# Patient Record
Sex: Male | Born: 1970 | Race: White | Hispanic: No | Marital: Single | Smoking: Current some day smoker
Health system: Southern US, Community
[De-identification: ages and names within clinical notes are randomized; demographics above are authoritative.]

## PROBLEM LIST (undated history)

## (undated) DIAGNOSIS — C9000 Multiple myeloma not having achieved remission: Secondary | ICD-10-CM

---

## 2012-04-22 ENCOUNTER — Emergency Department: Payer: Self-pay | Admitting: Emergency Medicine

## 2015-04-04 ENCOUNTER — Encounter: Payer: Self-pay | Admitting: Emergency Medicine

## 2015-04-04 ENCOUNTER — Emergency Department
Admission: EM | Admit: 2015-04-04 | Discharge: 2015-04-04 | Disposition: A | Discharge status: Home / Self Care | Payer: Medicaid Other | Attending: Emergency Medicine | Admitting: Emergency Medicine

## 2015-04-04 ENCOUNTER — Emergency Department: Payer: Medicaid Other

## 2015-04-04 DIAGNOSIS — F1123 Opioid dependence with withdrawal: Secondary | ICD-10-CM | POA: Diagnosis not present

## 2015-04-04 DIAGNOSIS — F1310 Sedative, hypnotic or anxiolytic abuse, uncomplicated: Secondary | ICD-10-CM | POA: Diagnosis not present

## 2015-04-04 DIAGNOSIS — R451 Restlessness and agitation: Secondary | ICD-10-CM | POA: Diagnosis not present

## 2015-04-04 DIAGNOSIS — R253 Fasciculation: Secondary | ICD-10-CM | POA: Diagnosis not present

## 2015-04-04 DIAGNOSIS — R569 Unspecified convulsions: Secondary | ICD-10-CM | POA: Diagnosis present

## 2015-04-04 DIAGNOSIS — F17200 Nicotine dependence, unspecified, uncomplicated: Secondary | ICD-10-CM | POA: Diagnosis not present

## 2015-04-04 DIAGNOSIS — Z880 Allergy status to penicillin: Secondary | ICD-10-CM | POA: Diagnosis not present

## 2015-04-04 DIAGNOSIS — F1193 Opioid use, unspecified with withdrawal: Secondary | ICD-10-CM

## 2015-04-04 HISTORY — DX: Multiple myeloma not having achieved remission: C90.00

## 2015-04-04 LAB — CBC
HCT: 42.5 % (ref 40.0–52.0)
Hemoglobin: 14.6 g/dL (ref 13.0–18.0)
MCH: 30.7 pg (ref 26.0–34.0)
MCHC: 34.5 g/dL (ref 32.0–36.0)
MCV: 89 fL (ref 80.0–100.0)
PLATELETS: 294 10*3/uL (ref 150–440)
RBC: 4.77 MIL/uL (ref 4.40–5.90)
RDW: 15 % — AB (ref 11.5–14.5)
WBC: 8.7 10*3/uL (ref 3.8–10.6)

## 2015-04-04 LAB — URINALYSIS COMPLETE WITH MICROSCOPIC (ARMC ONLY)
BACTERIA UA: NONE SEEN
Bilirubin Urine: NEGATIVE
Glucose, UA: NEGATIVE mg/dL
LEUKOCYTES UA: NEGATIVE
Nitrite: NEGATIVE
PH: 5 (ref 5.0–8.0)
PROTEIN: 30 mg/dL — AB
SPECIFIC GRAVITY, URINE: 1.02 (ref 1.005–1.030)

## 2015-04-04 LAB — URINE DRUG SCREEN, QUALITATIVE (ARMC ONLY)
Amphetamines, Ur Screen: NOT DETECTED
BARBITURATES, UR SCREEN: NOT DETECTED
Benzodiazepine, Ur Scrn: POSITIVE — AB
CANNABINOID 50 NG, UR ~~LOC~~: NOT DETECTED
COCAINE METABOLITE, UR ~~LOC~~: NOT DETECTED
MDMA (Ecstasy)Ur Screen: NOT DETECTED
Methadone Scn, Ur: NOT DETECTED
Opiate, Ur Screen: NOT DETECTED
Phencyclidine (PCP) Ur S: NOT DETECTED
TRICYCLIC, UR SCREEN: NOT DETECTED

## 2015-04-04 LAB — ETHANOL: Alcohol, Ethyl (B): <5 mg/dL (ref <5)

## 2015-04-04 LAB — ACETAMINOPHEN LEVEL

## 2015-04-04 LAB — BASIC METABOLIC PANEL
Anion gap: 10 (ref 5–15)
BUN: 11 mg/dL (ref 6–20)
CHLORIDE: 106 mmol/L (ref 101–111)
CO2: 23 mmol/L (ref 22–32)
Calcium: 9.9 mg/dL (ref 8.9–10.3)
Creatinine, Ser: 0.78 mg/dL (ref 0.61–1.24)
GFR calc Af Amer: >60 mL/min (ref >60)
GFR calc non Af Amer: >60 mL/min (ref >60)
GLUCOSE: 113 mg/dL — AB (ref 65–99)
POTASSIUM: 4.2 mmol/L (ref 3.5–5.1)
Sodium: 139 mmol/L (ref 135–145)

## 2015-04-04 LAB — HEPATIC FUNCTION PANEL
ALT: 248 U/L — AB (ref 17–63)
AST: 160 U/L — ABNORMAL HIGH (ref 15–41)
Albumin: 4.5 g/dL (ref 3.5–5.0)
Alkaline Phosphatase: 91 U/L (ref 38–126)
BILIRUBIN DIRECT: 0.3 mg/dL (ref 0.1–0.5)
BILIRUBIN INDIRECT: 1 mg/dL — AB (ref 0.3–0.9)
Total Bilirubin: 1.3 mg/dL — ABNORMAL HIGH (ref 0.3–1.2)
Total Protein: 7.7 g/dL (ref 6.5–8.1)

## 2015-04-04 LAB — LIPASE, BLOOD: Lipase: 20 U/L (ref 11–51)

## 2015-04-04 LAB — SALICYLATE LEVEL

## 2015-04-04 MED ORDER — DIPHENHYDRAMINE HCL 50 MG/ML IJ SOLN
50.0000 mg | Freq: Once | INTRAMUSCULAR | Status: DC
Start: 2015-04-04 — End: 2015-04-04

## 2015-04-04 MED ORDER — LORAZEPAM 2 MG/ML IJ SOLN
INTRAMUSCULAR | Status: AC
  Filled 2015-04-04: qty 1

## 2015-04-04 MED ORDER — LORAZEPAM 2 MG/ML IJ SOLN: 1.0000 mg | Freq: Once | INTRAMUSCULAR | Status: DC

## 2015-04-04 MED ORDER — LORAZEPAM 2 MG/ML IJ SOLN
1.0000 mg | Freq: Once | INTRAMUSCULAR | Status: AC
  Administered 2015-04-04: 1 mg via INTRAMUSCULAR

## 2015-04-04 MED ORDER — LORAZEPAM 2 MG/ML IJ SOLN
1.0000 mg | Freq: Once | INTRAMUSCULAR | Status: AC
Start: 2015-04-04 — End: 2015-04-04
  Administered 2015-04-04: 1 mg via INTRAMUSCULAR
  Filled 2015-04-04: qty 1

## 2015-04-04 MED ORDER — SODIUM CHLORIDE 0.9 % IV BOLUS (SEPSIS)
1000.0000 mL | Freq: Once | INTRAVENOUS | Status: AC
  Administered 2015-04-04: 1000 mL via INTRAVENOUS

## 2015-04-04 MED ORDER — PROMETHAZINE HCL 25 MG PO TABS: 25.0000 mg | ORAL_TABLET | Freq: Four times a day (QID) | ORAL | Status: DC | PRN

## 2015-04-04 MED ORDER — DIPHENHYDRAMINE HCL 50 MG/ML IJ SOLN
INTRAMUSCULAR | Status: AC
  Administered 2015-04-04: 50 mg via INTRAMUSCULAR
  Filled 2015-04-04: qty 1

## 2015-04-04 NOTE — ED Notes (Signed)
Pt went to ct scan by strecher.  Pt has constant movements especially of lower extremities.  Person at bedside says it has not changed since last night and was not improved by medication earlier.  md informed and benadryl given prior to patient going to ct.  Lab says cbc tube they drew is clotted. Will infomr md.

## 2015-04-04 NOTE — ED Notes (Signed)
Pt states has been shooting fentanyl and is attempting with withdrawal self from fentanyl. Pt's girlfriend states pt has a "lock up" seizure approx 30 min pta. Pt does not remember incident, no oral trauma, injury, or loss of bladder/bowel control noted. Skin pwd. perrl 64mm brisk, pt moves all extremities without difficulty. Pt complains of bilateral leg pain.

## 2015-04-04 NOTE — ED Notes (Signed)
Seizure pads placed, suction at bedside. Call bell on right side.

## 2015-04-04 NOTE — ED Provider Notes (Addendum)
-----------------------------------------   10:01 AM on 04/04/2015 -----------------------------------------  Patient's labs are largely within normal limits. Patient states last and only use approximately 36 hours ago. I discussed with the patient detox facilities, and TTS evaluation for detox. The patient states he has Suboxone at home, he does not want to be admitted or go to a facility for detox but cannot use Suboxone. Patient states he rather be discharged home, states he has done it before and he can do it again. We will prescribe Phenergan for the patient's nausea. Patient continues to feel very restless, discussed the patient use some Benadryl at home 50 mg every 8 hours for restlessness.   Unable to obtain a CBC and multiple attempts including finger stick CBC which clotted. Unable to obtain a CBC after multiple attempts including finger stick CBC which clotted. At this time I feel the patient's symptoms are very suggestive of opiate withdrawal, and I do not believe the CBC would change his management. We will discharge him at this time.  Harvest Dark, MD 04/04/15 [place], MD 04/04/15 1013

## 2015-04-04 NOTE — ED Notes (Signed)
Multiple IV insertion attempts made unsuccessfully. Patient continue to pace and shake uncontrollably throughout IV insertion attempts. MD Dahlia Client made aware.

## 2015-04-04 NOTE — Discharge Instructions (Signed)
Opioid Withdrawal  Opioids are a group of narcotic drugs. They include the street drug heroin. They also include pain medicines, such as morphine, hydrocodone, oxycodone, and fentanyl. Opioid withdrawal is a group of characteristic physical and mental signs and symptoms. It typically occurs if you have been using opioids daily for several weeks or longer and stop using or rapidly decrease use. Opioid withdrawal can also occur if you have used opioids daily for a long time and are given a medicine to block the effect.   SIGNS AND SYMPTOMS  Opioid withdrawal includes three or more of the following symptoms:   · Depressed, anxious, or irritable mood.  · Nausea or vomiting.  · Muscle aches or spasms.    · Watery eyes.     · Runny nose.  · Dilated pupils, sweating, or hairs standing on end.  · Diarrhea or intestinal cramping.  · Yawning.    · Fever.  · Increased blood pressure.  · Fast pulse.  · Restlessness or trouble sleeping.  These signs and symptoms occur within several hours of stopping or reducing short-acting opioids, such as heroin. They can occur within 3 days of stopping or reducing long-acting opioids, such as methadone. Withdrawal begins within minutes of receiving a drug that blocks the effects of opioids, such as naltrexone or naloxone.  DIAGNOSIS   Opioid use disorder is diagnosed by your health care provider. You will be asked about your symptoms, drug and alcohol use, medical history, and use of medicines. A physical exam may be done. Lab tests may be ordered. Your health care provider may have you see a mental health professional.   TREATMENT   The treatment for opioid withdrawal is usually provided by medical doctors with special training in substance use disorders (addiction specialists). The following medicines may be included in treatment:  · Opioids given in place of the abused opioid. They turn on opioid receptors in the brain and lessen or prevent withdrawal symptoms. They are gradually  decreased (opioid substitution and taper).  · Non-opioids that can lessen certain opioid withdrawal symptoms. They may be used alone or with opioid substitution and taper.  Successful long-term recovery usually requires medicine, counseling, and group support.  HOME CARE INSTRUCTIONS   · Take medicines only as directed by your health care provider.  · Check with your health care provider before starting new medicines.  · Keep all follow-up visits as directed by your health care provider.  SEEK MEDICAL CARE IF:  · You are not able to take your medicines as directed.  · Your symptoms get worse.  · You relapse.  SEEK IMMEDIATE MEDICAL CARE IF:  · You have serious thoughts about hurting yourself or others.  · You have a seizure.  · You lose consciousness.     This information is not intended to replace advice given to you by your health care provider. Make sure you discuss any questions you have with your health care provider.     Document Released: 01/11/2003 Document Revised: 01/29/2014 Document Reviewed: 01/21/2013  Elsevier Interactive Patient Education ©2016 [employer]

## 2015-04-04 NOTE — ED Provider Notes (Signed)
[facility] Emergency Department Provider Note  ____________________________________________  Time seen: Approximately 530 AM  I have reviewed the triage vital signs and the nursing notes.   HISTORY  Chief Complaint Seizures    HPI Devon Bradley. is a 45 y.o. male comes into the hospital today with a seizure. The patient reports that he is withdrawing from fentanyl. He goes to a Suboxone clinic and reports that the only drug he uses is fentanyl. He reports that he drinks a couple of beers but it's nothing really. According to the patient's girlfriend it appeared though he was having a seizure. He has been trying to wean himself from his fentanyl and has been 30 hours since he last used. His girlfriend reports that they were in bed and he's been very twitchy and unable to stay still. She reports that when she looked over he appeared to be locked up and couldn't move. She reports that she is unable to know if he was awake as he was laying next to her when she turned the lights on he was very out of it. She reports that the seizure lasted approximately 2 minutes. The patient has been very restless and shaky. He is also been having some vomiting and diarrhea. He has not been eating much and has been very sweaty. The patient denies any abdominal pain at this time. The patient's girlfriend was concerned so she brought him in for evaluation.   Past Medical History  Diagnosis Date  . Metastatic multiple myeloma to bone (HCC)     There are no active problems to display for this patient.   History reviewed. No pertinent past surgical history.  No current outpatient prescriptions on file.  Allergies Amoxicillin  History reviewed. No pertinent family history.  Social History Social History  Substance Use Topics  . Smoking status: Current Some Day Smoker  . Smokeless tobacco: Never Used  . Alcohol Use: No    Review of Systems Constitutional: No  fever/chills Eyes: No visual changes. ENT: No sore throat. Cardiovascular: Denies chest pain. Respiratory: Denies shortness of breath. Gastrointestinal: No abdominal pain.  No nausea, no vomiting.  No diarrhea.  No constipation. Genitourinary: Negative for dysuria. Musculoskeletal: Negative for back pain. Skin: Negative for rash. Neurological: Restlessness and shakes, seizure  10-point ROS otherwise negative.  ____________________________________________   PHYSICAL EXAM:  VITAL SIGNS: ED Triage Vitals  Enc Vitals Group     BP 04/04/15 0505 125/82 mmHg     Pulse Rate 04/04/15 0505 68     Resp 04/04/15 0505 22     Temp 04/04/15 0505 98.7 F (37.1 C)     Temp Source 04/04/15 0505 Oral     SpO2 04/04/15 0505 96 %     Weight 04/04/15 0505 225 lb (102.059 kg)     Height 04/04/15 0505 6' 1"  (1.854 m)     Head Cir --      Peak Flow --      Pain Score --      Pain Loc --      Pain Edu? --      Excl. in [place]? --     Constitutional: Alert and oriented. Well appearing and in no acute distress. Eyes: Conjunctivae are normal. PERRL. EOMI. Head: Atraumatic. Nose: No congestion/rhinnorhea. Mouth/Throat: Mucous membranes are moist.  Oropharynx non-erythematous. Cardiovascular: Normal rate, regular rhythm. Grossly normal heart sounds.  Good peripheral circulation. Respiratory: Normal respiratory effort.  No retractions. Lungs CTAB. Gastrointestinal: Soft and nontender. No distention.  Positive bowel sounds Musculoskeletal: No lower extremity tenderness nor edema.   Neurologic:  Normal speech and language. Cranial nerves II through XII are grossly intact with no focal motor or neuro deficits Skin:  Skin is warm, dry and intact.  Psychiatric: Mood and affect are normal.   ____________________________________________   LABS (all labs ordered are listed, but only abnormal results are displayed)  Labs Reviewed  BASIC METABOLIC PANEL - Abnormal; Notable for the following:    Glucose,  Bld 113 (*)    All other components within normal limits  ACETAMINOPHEN LEVEL - Abnormal; Notable for the following:    Acetaminophen (Tylenol), Serum <10 (*)    All other components within normal limits  HEPATIC FUNCTION PANEL - Abnormal; Notable for the following:    AST 160 (*)    ALT 248 (*)    Total Bilirubin 1.3 (*)    Indirect Bilirubin 1.0 (*)    All other components within normal limits  ETHANOL  SALICYLATE LEVEL  URINALYSIS COMPLETEWITH MICROSCOPIC ([facility] ONLY)  URINE DRUG SCREEN, QUALITATIVE ([facility] ONLY)  CBC  LIPASE, BLOOD   ____________________________________________  EKG  ED ECG REPORT I, Loney Hering, the attending physician, personally viewed and interpreted this ECG.   Date: 04/04/2015  EKG Time: 716  Rate: 90  Rhythm: normal sinus rhythm  Axis: normal  Intervals:none  ST&T Change: none  ____________________________________________  RADIOLOGY  CT head: pending ____________________________________________   PROCEDURES  Procedure(s) performed: None  Critical Care performed: No  ____________________________________________   INITIAL IMPRESSION / ASSESSMENT AND PLAN / ED COURSE  Pertinent labs & imaging results that were available during my care of the patient were reviewed by me and considered in my medical decision making (see chart for details).  This is a 45 year old male who was weaning himself off from that no who comes in today with a seizure. The patient is very jittery and twitchy. They're having a hard time finding veins on him. The patient will be given 1 mg of Ativan IM and some normal saline. We will check some blood work and reassess the patient.  At this time we are awaiting the results of the patient's blood work. He has received 2 doses of ativan as he was very shaky on  Arrival to the ED. Staff had a difficult time obtaining an IV due to the patient's IV drug abuse history. We will give the patient some normal saline and  await the results of the remaining blood work. The patient's care will be signed out to Dr. Kerman Passey who will reassess the patient and follow up the results of his labs.   The patient received Benadryl as well IM. ____________________________________________   FINAL CLINICAL IMPRESSION(S) / ED DIAGNOSES  Final diagnoses:  Seizure ([place])  Opiate withdrawal ([place])  Twitching      Loney Hering, MD 04/04/15 [phone]

## 2016-02-15 ENCOUNTER — Emergency Department
Admission: EM | Admit: 2016-02-15 | Discharge: 2016-02-15 | Disposition: A | Discharge status: Home / Self Care | Payer: Self-pay | Attending: Emergency Medicine | Admitting: Emergency Medicine

## 2016-02-15 ENCOUNTER — Emergency Department: Payer: Self-pay

## 2016-02-15 ENCOUNTER — Encounter: Payer: Self-pay | Admitting: *Deleted

## 2016-02-15 DIAGNOSIS — F1190 Opioid use, unspecified, uncomplicated: Secondary | ICD-10-CM

## 2016-02-15 DIAGNOSIS — Y999 Unspecified external cause status: Secondary | ICD-10-CM | POA: Insufficient documentation

## 2016-02-15 DIAGNOSIS — W010XXA Fall on same level from slipping, tripping and stumbling without subsequent striking against object, initial encounter: Secondary | ICD-10-CM | POA: Insufficient documentation

## 2016-02-15 DIAGNOSIS — S40011A Contusion of right shoulder, initial encounter: Secondary | ICD-10-CM | POA: Insufficient documentation

## 2016-02-15 DIAGNOSIS — S40851A Superficial foreign body of right upper arm, initial encounter: Secondary | ICD-10-CM | POA: Insufficient documentation

## 2016-02-15 DIAGNOSIS — F17200 Nicotine dependence, unspecified, uncomplicated: Secondary | ICD-10-CM | POA: Insufficient documentation

## 2016-02-15 DIAGNOSIS — Y929 Unspecified place or not applicable: Secondary | ICD-10-CM | POA: Insufficient documentation

## 2016-02-15 DIAGNOSIS — Y9389 Activity, other specified: Secondary | ICD-10-CM | POA: Insufficient documentation

## 2016-02-15 DIAGNOSIS — F1110 Opioid abuse, uncomplicated: Secondary | ICD-10-CM | POA: Insufficient documentation

## 2016-02-15 DIAGNOSIS — S40251A Superficial foreign body of right shoulder, initial encounter: Secondary | ICD-10-CM

## 2016-02-15 MED ORDER — KETOROLAC TROMETHAMINE 30 MG/ML IJ SOLN
30.0000 mg | Freq: Once | INTRAMUSCULAR | Status: AC
  Administered 2016-02-15: 30 mg via INTRAMUSCULAR
  Filled 2016-02-15: qty 1

## 2016-02-15 MED ORDER — NAPROXEN 500 MG PO TABS: 500.0000 mg | ORAL_TABLET | Freq: Two times a day (BID) | ORAL | 0 refills | Status: AC

## 2016-02-15 NOTE — Discharge Instructions (Signed)
May take OTC Tylenol as needed in conjunction with naprosen.

## 2016-02-15 NOTE — ED Notes (Signed)
See triage note  [place] this am landed on right arm  Now having tingling into right arm

## 2016-02-15 NOTE — ED Triage Notes (Signed)
States he has "broken syringes in his right arm" from a hx of drug abuse, states he fell in the shower this AM and his shoulder pain began when he landed on the original area
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
## 2017-09-21 IMAGING — CT CT HEAD W/O CM
4 series · 19 of 30 positions shown, 20 images · non-contrast
Comparison: None

CLINICAL DATA: Seizure.

EXAM:
CT HEAD WITHOUT CONTRAST
TECHNIQUE: Contiguous axial images were obtained from the base of the skull
through the vertex without intravenous contrast.

[Series 2: head wo · axial · 0.47mm/px · z∈[-110,-50]mm · 2 of 38 slices shown, 3 images]
[im 13/38  brain]
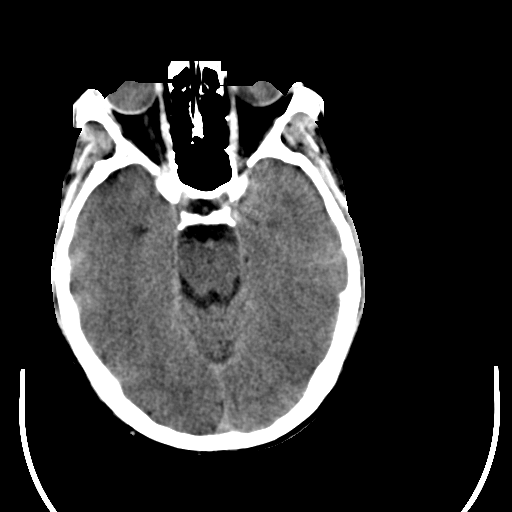
[im 13/38  bone]
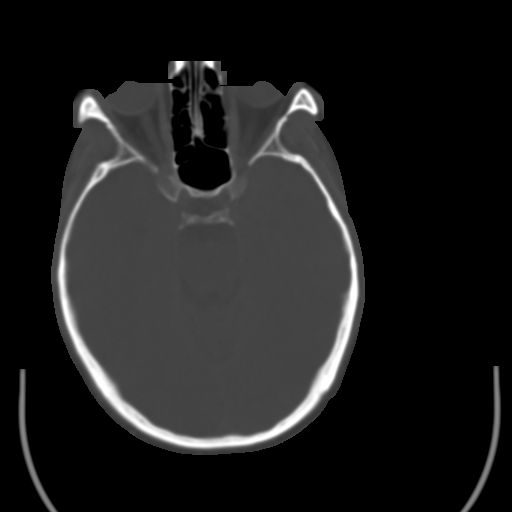
[im 25/38  brain]
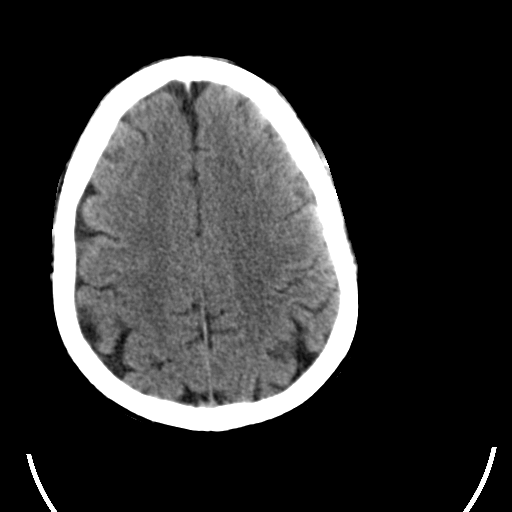

[Series 3: head bone · axial · 0.47mm/px · z∈[-152,-6]mm · 8 of 93 slices shown]
[im 10/93  bone]
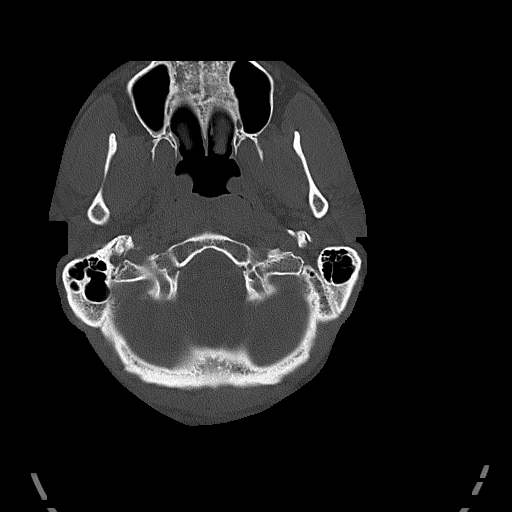
[im 19/93  bone]
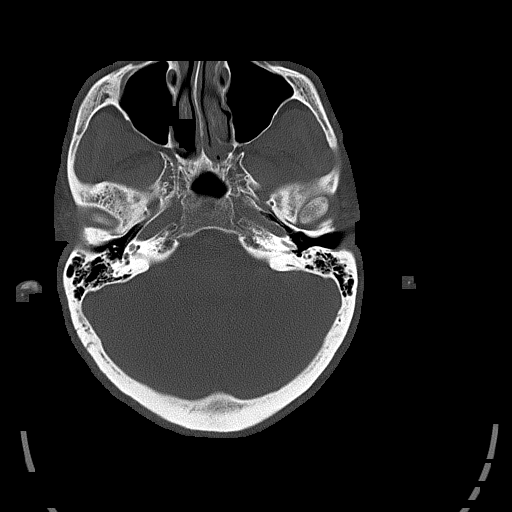
[im 28/93  bone]
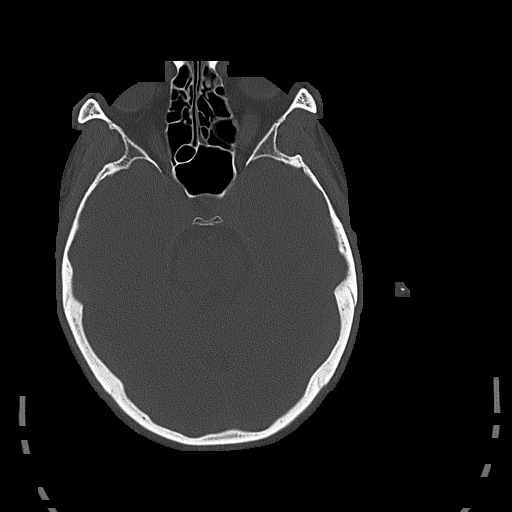
[im 37/93  bone]
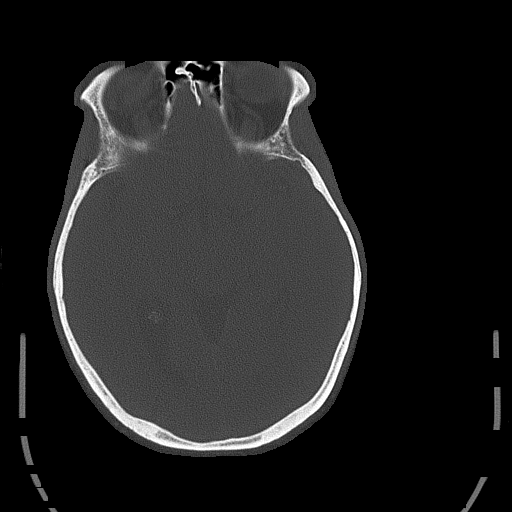
[im 56/93  bone]
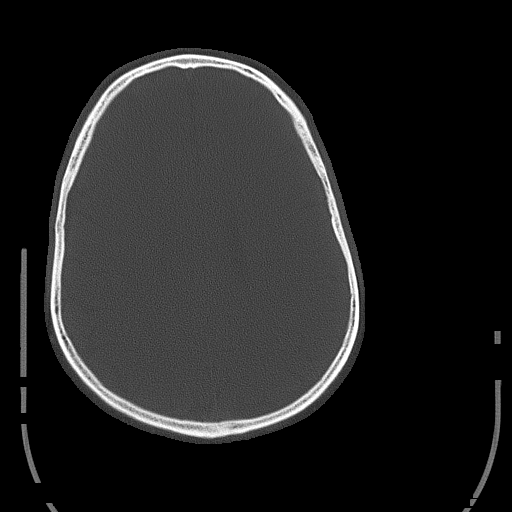
[im 65/93  bone]
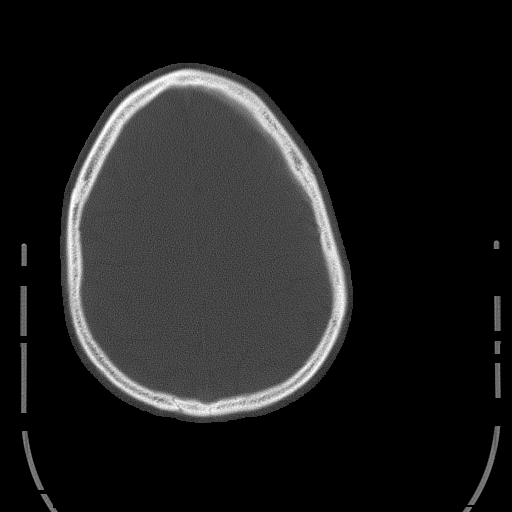
[im 74/93  bone]
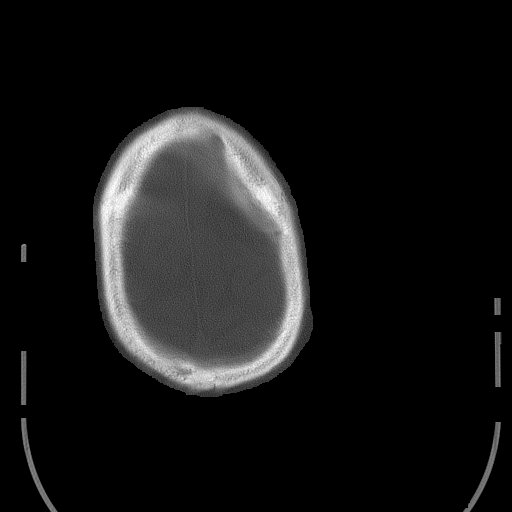
[im 83/93  bone]
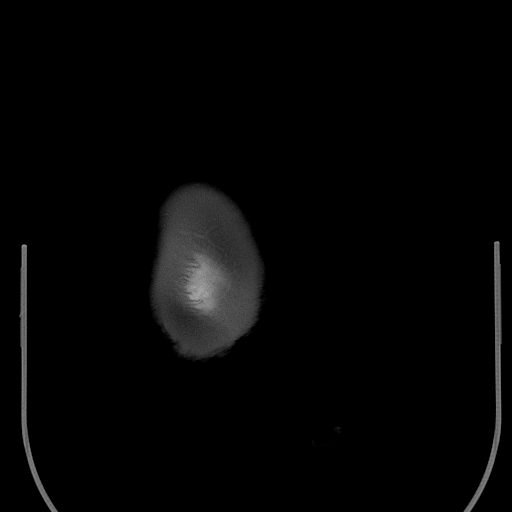

[Series 4: head wo recon · axial · 0.39mm/px · z∈[-60,-7]mm · 2 of 38 slices shown]
[im 13/38  brain]
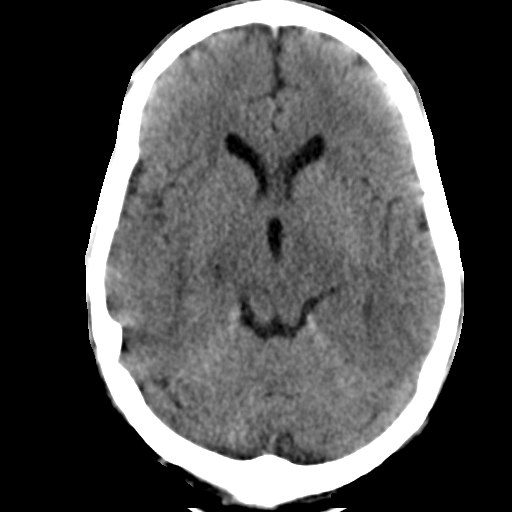
[im 25/38  brain]
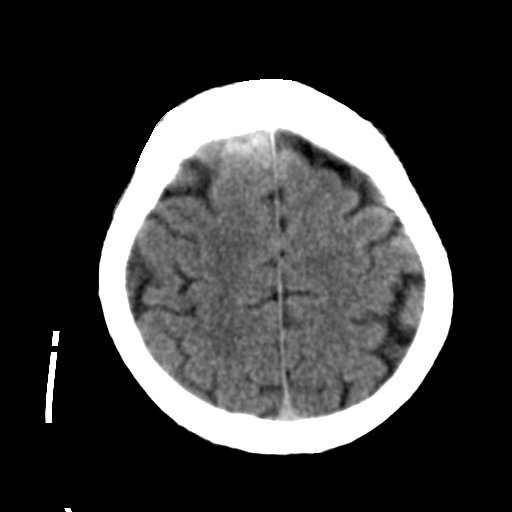

[Series 5: head bone recon · axial · 0.39mm/px · z∈[-110,+9]mm · 7 of 93 slices shown]
[im 10/93  bone]
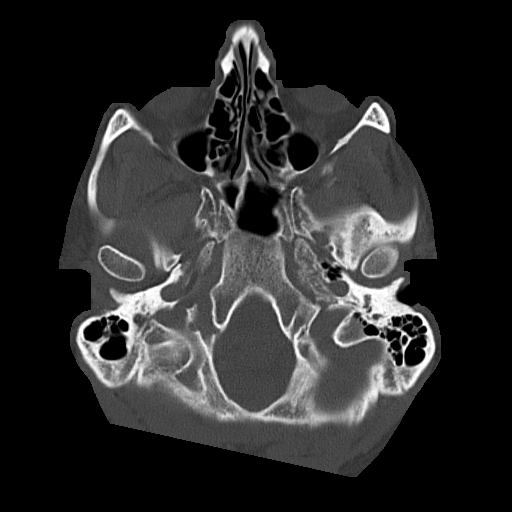
[im 19/93  bone]
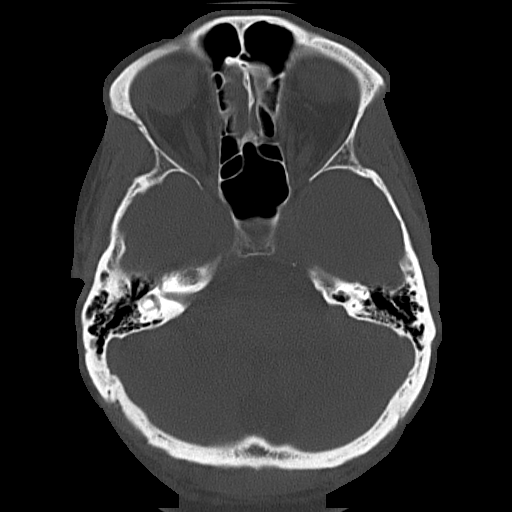
[im 28/93  bone]
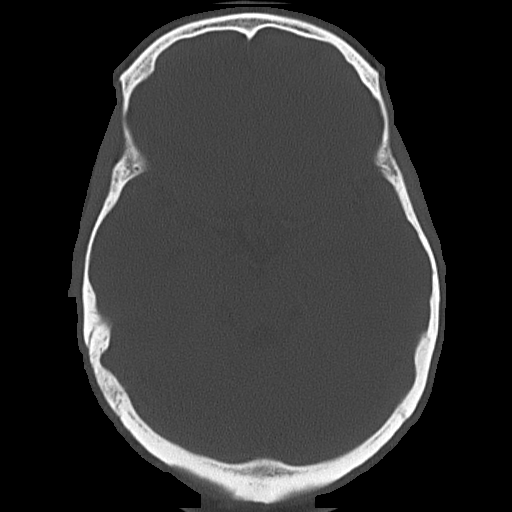
[im 37/93  bone]
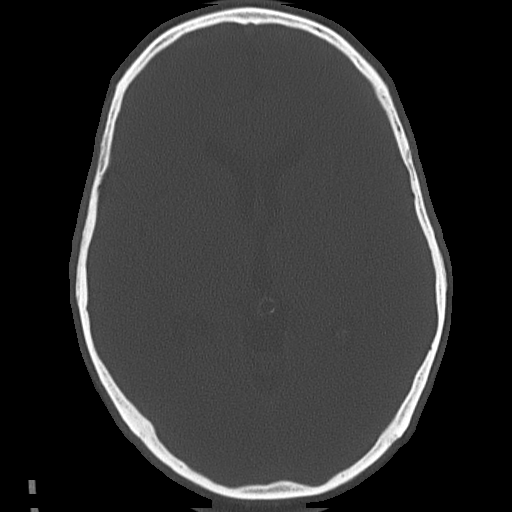
[im 56/93  bone]
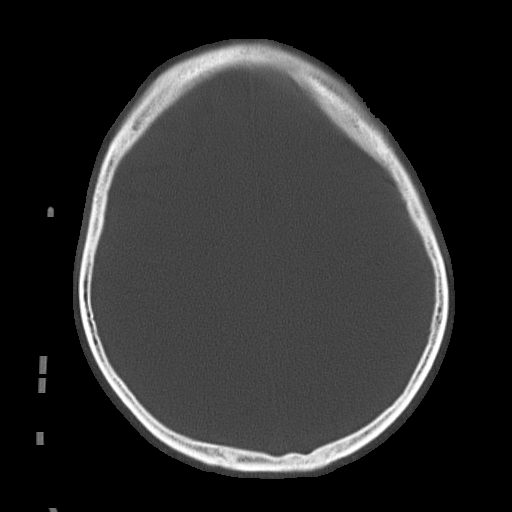
[im 65/93  bone]
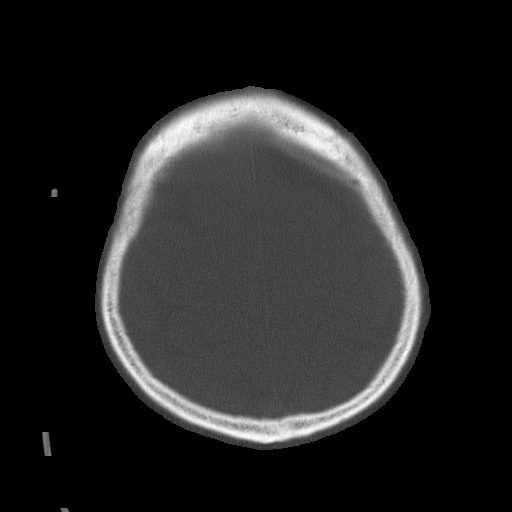
[im 74/93  bone]
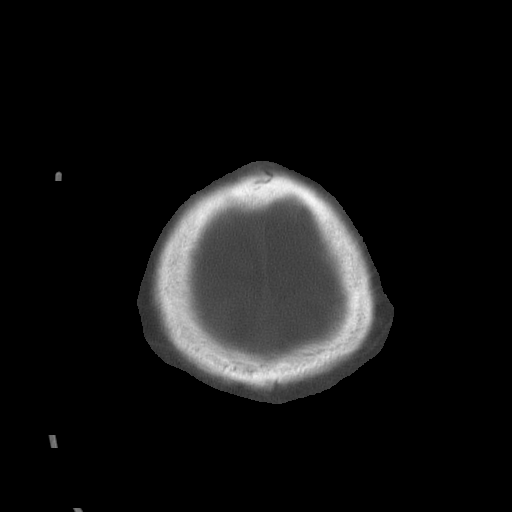

[19 of 30 positions shown; findings below may reference images not displayed]

FINDINGS: No mass lesion. No midline shift. No acute hemorrhage or hematoma.
No extra-axial fluid collections. No evidence of acute infarction.
Brain parenchyma is normal. Bones are normal.
IMPRESSION: Normal exam.

## 2018-08-04 IMAGING — CR DG SHOULDER 2+V*R*
1 series · 3 of 3 positions shown · non-contrast
Comparison: None.

CLINICAL DATA: Fell and injured right shoulder this morning. Right
shoulder pain. History of multiple myeloma.

EXAM:
RIGHT SHOULDER - 2+ VIEW

[Series 1: dg shoulder right · 0.14mm/px · 3 of 3 slices shown]
[im 1/3]
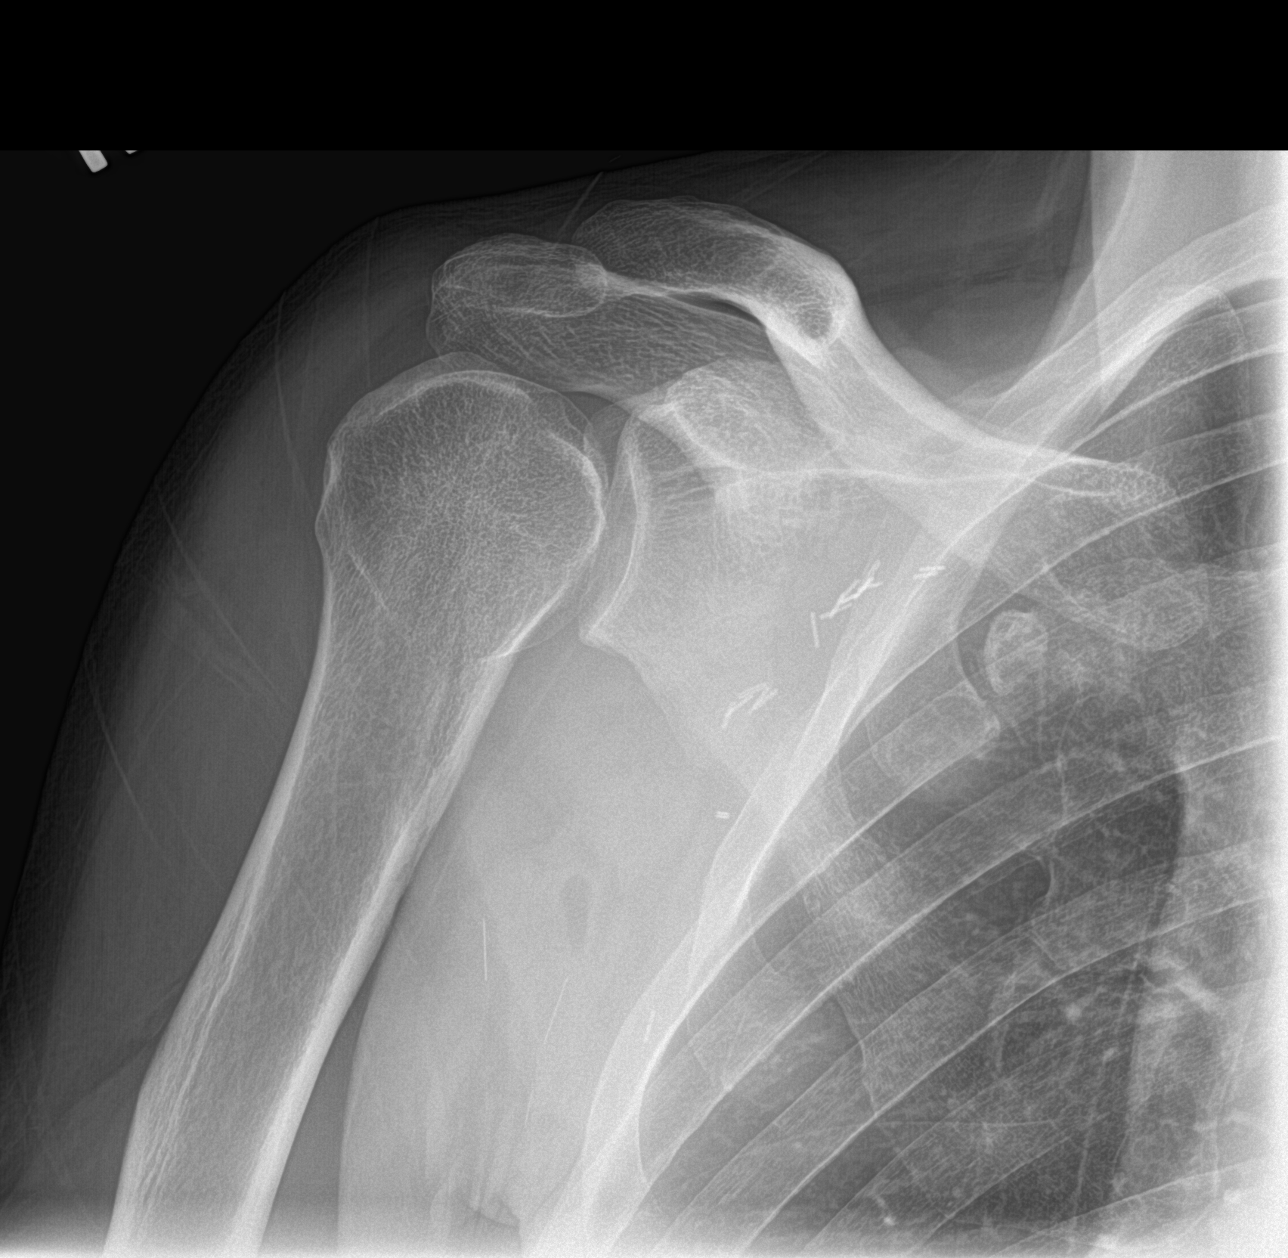
[im 2/3]
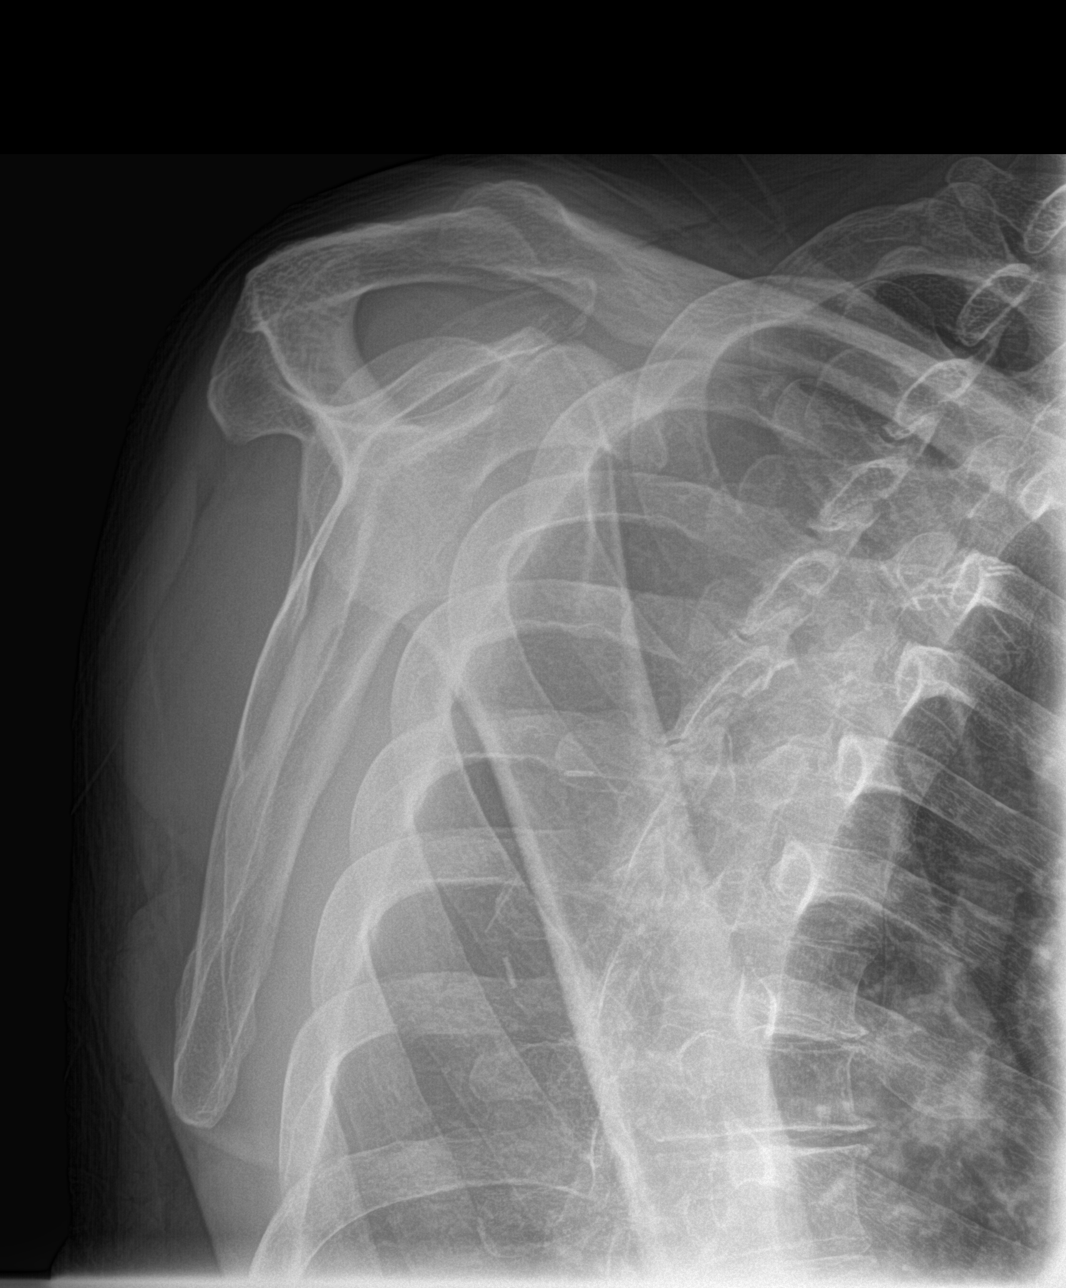
[im 3/3]
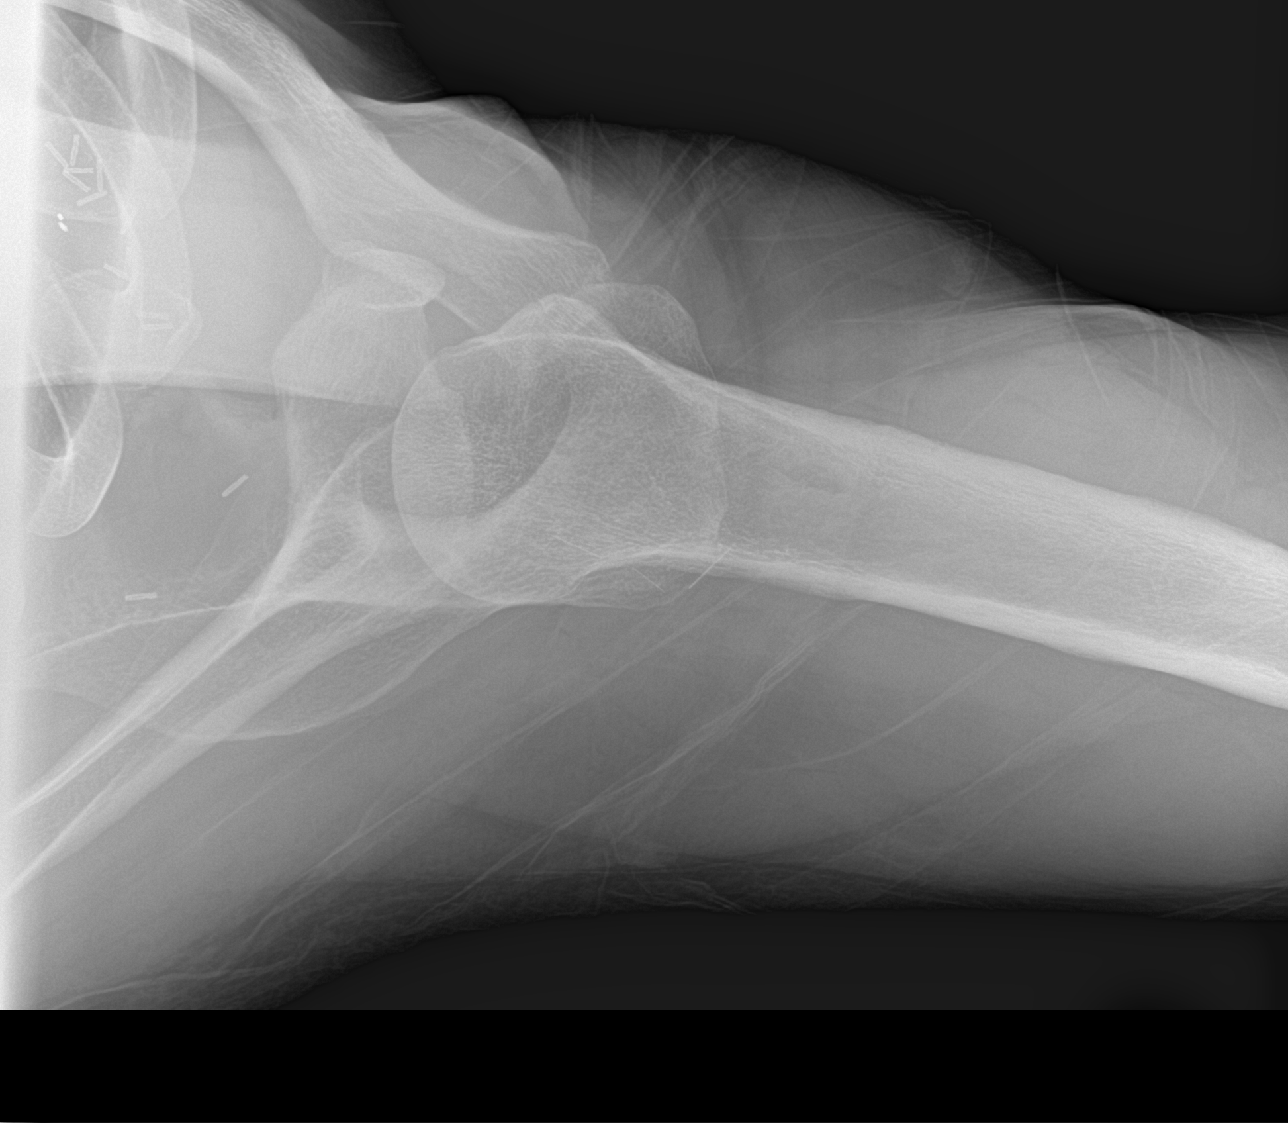

[3 of 3 positions shown; findings below may reference images not displayed]

FINDINGS: No fracture or dislocation. The AC joint and glenohumeral joints are
intact. Minimal degenerative changes. No abnormal soft tissue
calcifications. Surgical clips are noted in the right axillary and
chest wall region. There are also small radiopaque foreign bodies
noted in the right axilla. The visualized right lung is clear.
IMPRESSION: No acute bony findings.
